# Patient Record
Sex: Female | Born: 1950 | Hispanic: No | Marital: Married | Smoking: Never smoker
Health system: Southern US, Community
[De-identification: ages and names within clinical notes are randomized; demographics above are authoritative.]

## PROBLEM LIST (undated history)

## (undated) DIAGNOSIS — E079 Disorder of thyroid, unspecified: Secondary | ICD-10-CM

## (undated) DIAGNOSIS — H269 Unspecified cataract: Secondary | ICD-10-CM

## (undated) DIAGNOSIS — M1990 Unspecified osteoarthritis, unspecified site: Secondary | ICD-10-CM

## (undated) HISTORY — DX: Disorder of thyroid, unspecified: E07.9

## (undated) HISTORY — DX: Unspecified cataract: H26.9

## (undated) HISTORY — PX: EYE SURGERY: SHX253

## (undated) HISTORY — PX: JOINT REPLACEMENT: SHX530

## (undated) HISTORY — DX: Unspecified osteoarthritis, unspecified site: M19.90

---

## 2003-03-18 ENCOUNTER — Other Ambulatory Visit: Admission: RE | Admit: 2003-03-18 | Discharge: 2003-03-18 | Payer: Self-pay | Admitting: Obstetrics and Gynecology

## 2004-08-14 ENCOUNTER — Other Ambulatory Visit: Admission: RE | Admit: 2004-08-14 | Discharge: 2004-08-14 | Payer: Self-pay | Admitting: Obstetrics and Gynecology

## 2005-03-02 ENCOUNTER — Encounter: Admission: RE | Admit: 2005-03-02 | Discharge: 2005-03-02 | Payer: Self-pay | Admitting: Family Medicine

## 2005-03-04 ENCOUNTER — Encounter: Admission: RE | Admit: 2005-03-04 | Discharge: 2005-03-04 | Payer: Self-pay | Admitting: Family Medicine

## 2005-05-03 ENCOUNTER — Ambulatory Visit (HOSPITAL_BASED_OUTPATIENT_CLINIC_OR_DEPARTMENT_OTHER): Admission: RE | Admit: 2005-05-03 | Discharge: 2005-05-03 | Payer: Self-pay | Admitting: Orthopedic Surgery

## 2005-05-03 ENCOUNTER — Ambulatory Visit (HOSPITAL_COMMUNITY): Admission: RE | Admit: 2005-05-03 | Discharge: 2005-05-03 | Payer: Self-pay | Admitting: Orthopedic Surgery

## 2007-04-08 ENCOUNTER — Inpatient Hospital Stay (HOSPITAL_COMMUNITY): Admission: RE | Admit: 2007-04-08 | Discharge: 2007-04-12 | Payer: Self-pay | Admitting: Orthopedic Surgery

## 2008-09-16 IMAGING — CR DG KNEE 1-2V PORT*R*
2 series · 2 of 2 positions shown · non-contrast
Comparison: None.

CLINICAL DATA: Postop right total knee arthroplasty.

PORTABLE RIGHT KNEE - 2 VIEW  [DATE]/[id number] [id number] hours:

[ap/obl knee]
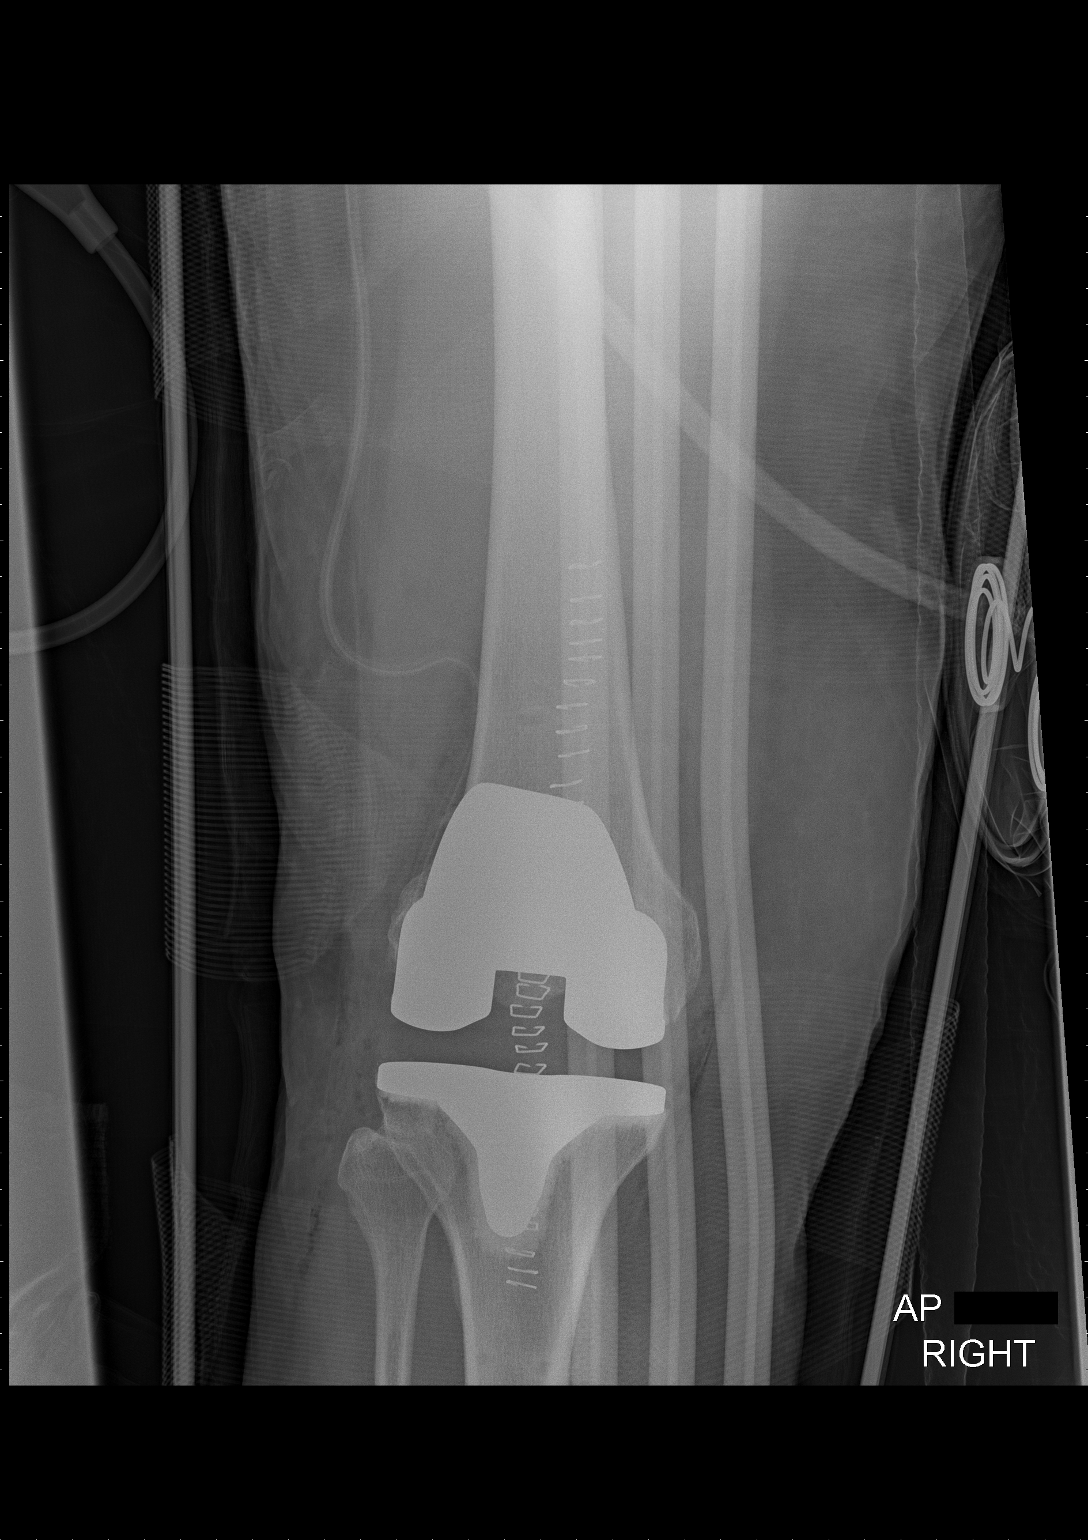

[knee lat]
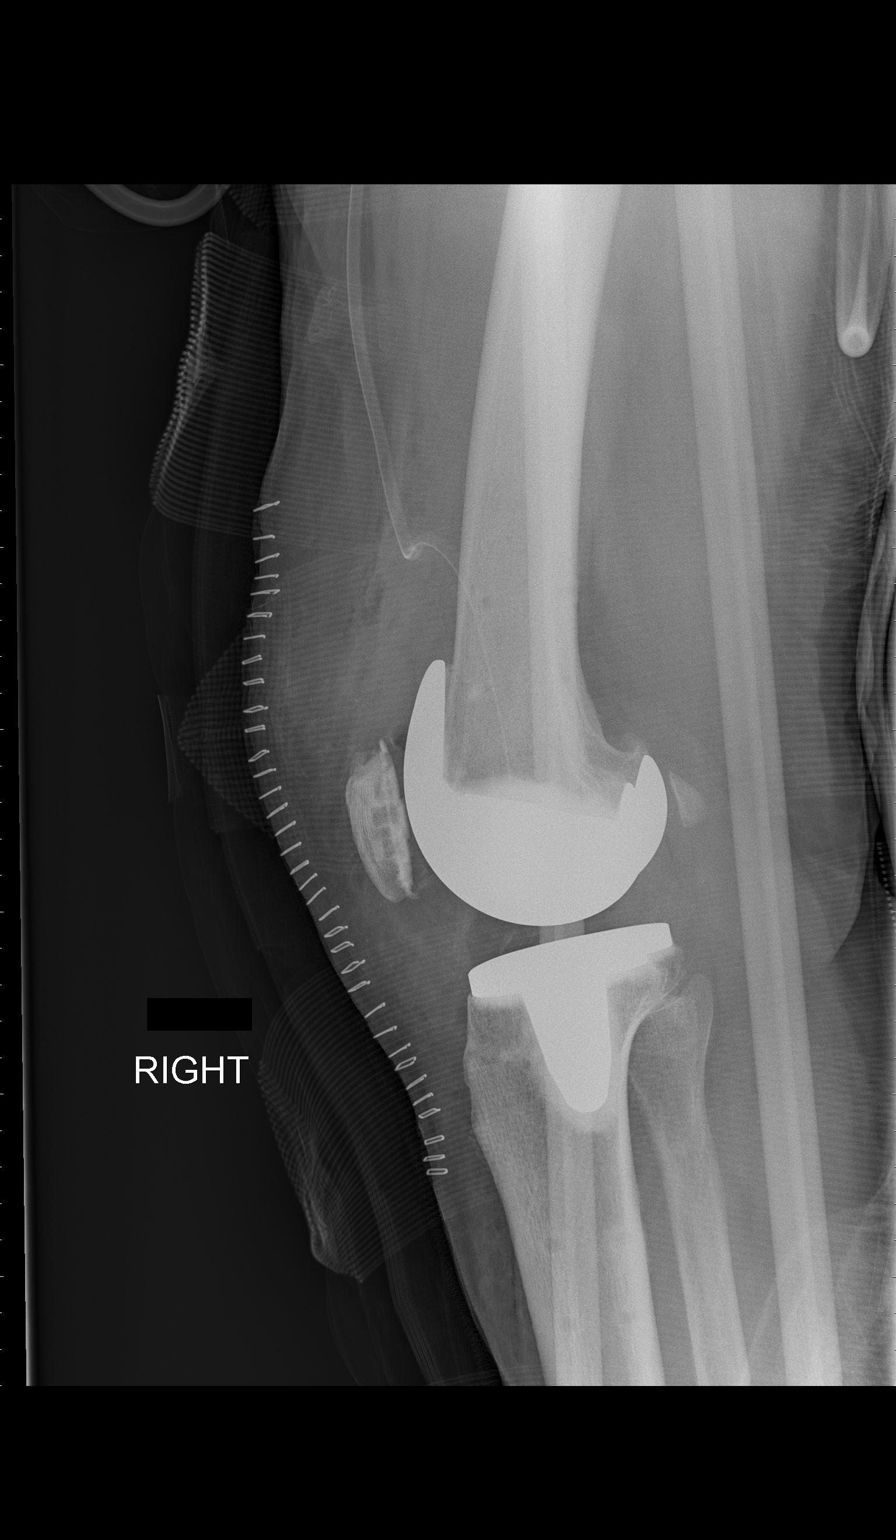

[2 of 2 positions shown; findings below may reference images not displayed]

FINDINGS: Right total knee arthroplasty with anatomic alignment. No acute
complicating features. Surgical drain in place.
IMPRESSION: Anatomic alignment status post right total knee arthroplasty without acute
complicating features.

## 2010-10-03 NOTE — Op Note (Signed)
NAME:  GLEMA, TAKAKI NO.:  [id number]   MEDICAL RECORD NO.:  [id number]          PATIENT TYPE:  INP   LOCATION:  5032                         FACILITY:  [facility]   PHYSICIAN:  Burnard Bunting, M.D.    DATE OF BIRTH:  June 29, 1950   DATE OF PROCEDURE:  04/08/2007  DATE OF DISCHARGE:                               OPERATIVE REPORT   PREOPERATIVE DIAGNOSIS:  Right knee arthritis.   POSTOPERATIVE DIAGNOSIS:  Right knee arthritis.   PROCEDURE:  Right total knee replacement.   SURGEON:  Burnard Bunting, M.D.   ASSISTANT:  Wende Neighbors, P.A.   ANESTHESIA:  General tracheal.   BLOOD LOSS:  100 mL.   DRAINS:  None.   TOURNIQUET TIME:  1 hour and 55 minutes at 300 mmHg.   INDICATIONS:  Katie Houston is a 60 year old patient with end-stage  right knee arthritis, who presents now for total knee replacement after  failure of conservative management.   COMPONENTS UTILIZED:  DePuy size 3 femur, 3 tibia, 12.5 poly insert,  posterior stabilized with 32 patella.   PROCEDURE IN DETAIL:  The patient brought to operating room where  general endotracheal anesthesia was induced.  Time out was called.  Right leg was prepped with DuraPrep solution and draped in a sterile  manner.  Collier Flowers was used to cover the operative field.  The leg was  elevated and exsanguinated with Esmarch wrap.  Tourniquet was inflated.  Anterior approach to the knee was utilized and skin and subcutaneous  tissue sharply divided.  The median parapatellar arthrotomy was made.  Precise location of the arthrotomy was marked #1 Vicryl suture.  Patellar fat pad was partially excised.  Soft tissue above the  periosteum was removed from the anterior distal femur and lateral  patellofemoral ligament was then released.  Two pins were then placed in  the anterior distal medial femur.  Bicortical fixation was achieved.  Two pins were placed in the proximal medial tibia.  Bicortical fixation  was achieved.   Registration points were then obtained beginning with hip  center of rotation, medial and lateral malleolus and points around the  knee.  ACL and PCL were released in accordance with preoperative  templating and computer-generated modeling, cut perpendicular to the  tibia was made.  Collateral ligaments and the posterior structures were  well protected.  The tibial cut was then made the oscillating saw.  The  tensioner was then placed in full extension in full in 90 degrees of  flexion.  Distal femoral cut was then made.  Spacer blocks confirmed  that the knee was in neutral mechanical orientation and achieved full  extension.  At this time the anterior, posterior and chamfer cuts were  completed.  The box cut was performed.  The trial reduction was then  performed with the components in position.  The patient had a full  extension, full flexion with excellent patellar tracking and a good  stability to varus valgus stress at 0, 30 and 90 degrees with the 12.5  mm spacer.  The tibial keel punch was then performed.  The patella was  then prepared freehand fashion with a 11-mm resection off 24-mm patella.  Trial 32 was then drilled and placed.  Again stability was maintained,  full extension was achieved and patient had good stability varus-valgus  stress at 0, 30 and 90 degrees flexion.  Trial components were removed.  True components were then cemented into position.  Excess cement was  removed.  Tourniquet was released.  Bleeding points encountered were  controlled using electrocautery.  Hemovac drain was placed.  Median  parapatellar arthrotomy was then closed using interrupted inverted 0  Vicryl suture after placement of a Hemovac.  The next layer was closed  using interrupted inverted 0 Vicryl suture followed by interrupted  inverted 2-0 Vicryl suture and the skin staples.  Pins for registration  points were removed and this incision re-irrigated and closed using 3-0  nylon suture.  The  patient did wrapped in a bulky Jones dressing and  knee immobilizer was placed.  Tolerated well without immediate  complication.      Burnard Bunting, M.D.  Electronically Signed     GSD/MEDQ  D:  04/08/2007  T:  04/09/2007  Job:  [id number]

## 2010-10-06 NOTE — Discharge Summary (Signed)
NAME:  Katie Houston, Katie Houston NO.:  [id number]   MEDICAL RECORD NO.:  [id number]          PATIENT TYPE:  INP   LOCATION:  5032                         FACILITY:  [facility]   PHYSICIAN:  Wende Neighbors, P.A. DATE OF BIRTH:  1951/04/22   DATE OF ADMISSION:  04/08/2007  DATE OF DISCHARGE:  04/12/2007                               DISCHARGE SUMMARY   ADMISSION DIAGNOSIS:  Right knee arthritis.   DISCHARGE DIAGNOSIS:  1. Right knee arthritis.  2. Mild posthemorrhagic anemia.   PROCEDURE:  On April 08, 2007 the patient underwent right total knee  arthroplasty performed by Dr. August Saucer, assisted by Maud Deed, PA-C  under general anesthesia.   CONSULTATIONS:  None.   BRIEF HISTORY:  The patient is a 60 year old female with end-stage right  knee arthritis.  She has had failure of conservative management and is  admitted for right total knee replacement.   BRIEF HOSPITAL COURSE:  The patient tolerated the procedure under  general anesthesia without complications.  Postoperatively she was  placed on the usual physical therapy protocol for total knee replacement  involving CPM machine, active range of motion, passive range of motion,  stretching, strengthening and ambulation.  The patient was allowed  weightbearing as tolerated on the operative extremity.  She was able to  ambulate with a walker and use her knee immobilizer for ambulatory  purposes.  Neurovascular motor function of the lower extremities  remained intact throughout the hospital stay.  The Hemovac drain was  discontinued on the first postoperative day and her wound was healing  well through the hospital stay.  Coumadin was started for DVT  prophylaxis and managed by the pharmacy at [facility].  Hemoglobin  and hematocrit dropped to the lowest value of 10.8 and 32.3  postoperatively.  INR at discharge was 2.1.  Chemistry studies normal  through the hospital stay with the exception of one episode of  hyponatremia.  Urinalysis on admission with large bilirubin, trace  leukocyte esterase.  Urine culture no growth.  Chest x-ray on admission  no active lung disease.  EKG on admission sinus bradycardia, no  significant change since last tracing confirmed by Dr. Katrinka Blazing.   PLAN:  The patient was discharged home.  She will utilize her CPM  machine 2 hours out of every 8 hours and increase 5 degrees daily.  She  is advised to keep her incision dry and clean and change the dressing as  needed.  Home health physical therapy to assist with ambulation and gait  training, range of motion, stretching and strengthening exercises.  Knee  immobilizer to be discontinued when the patient is able to do a straight  leg raise.  Genevieve Norlander to assist the patient with Coumadin management.  The  patient will follow up with Dr. August Saucer 2 weeks from  surgery.  Prescriptions given at discharge, Percocet one to two every 4-  6 hours as needed for pain, Robaxin 500 mg one every 8 hours as needed  for spasm.  She will not resume her etodolac which was taken prior to  admission.  All questions encouraged and answered.  Wende Neighbors, P.A.     SMV/MEDQ  D:  05/29/2007  T:  05/30/2007  Job:  [id number]

## 2010-10-06 NOTE — Op Note (Signed)
NAME:  Katie Houston, Katie Houston NO.:  [id number]   MEDICAL RECORD NO.:  [id number]          PATIENT TYPE:  AMB   LOCATION:  DSC                          FACILITY:  [facility]   PHYSICIAN:  Nadara Mustard, MD     DATE OF BIRTH:  07-Apr-1951   DATE OF PROCEDURE:  05/03/2005  DATE OF DISCHARGE:                                 OPERATIVE REPORT   PREOPERATIVE DIAGNOSIS:  Haglund deformity with Achilles tendinitis, left  ankle.   POSTOPERATIVE DIAGNOSIS:  Haglund deformity with Achilles tendinitis, left  ankle.   PROCEDURE:  Haglund resection endoscopically, left ankle.   SURGEON:  Nadara Mustard, MD   ANESTHESIA:  General.   ESTIMATED BLOOD LOSS:  Minimal.   ANTIBIOTICS:  One gram of Kefzol.   DRAINS:  None.   COMPLICATIONS:  None.   TOURNIQUET TIME:  None.   DISPOSITION:  To PACU in stable condition.   INDICATION FOR PROCEDURE:  The patient is a 60 year old woman with Achilles  tendinitis and a painful Haglund deformity.  The patient has failed  conservative care including therapy and activity modifications, as well as  anti-inflammatories, and presents at this time for surgical intervention.  The risks and benefits were discussed including infection, neurovascular  injury, persistent pain, rupture the Achilles, need for additional surgery.  The patient states he understands and wishes to proceed at this time.   DESCRIPTION OF PROCEDURE:  The patient was brought to OR room 8 and  underwent general anesthetic.  After an adequate level of anesthesia was  obtained, the patient was placed prone on the operating table and the left  lower extremity was prepped using DuraPrep and draped into a sterile field.  The inflow and outflow portals were established both medially and laterally  just proximal to the Haglund deformity, medial and lateral to the Achilles.  A blunt trocar was inserted through both of the portals and the shaver with  the acromionizer blade was rotated  between both portals as well as inflow  being rotated and the inflow was with normal saline with gravity.  Using the  C-arm fluoroscopy for direct visualization, the Haglund deformity was  resected.  The Achilles tendon was intact postoperatively.  She had good  range of motion of the ankle.  The instruments were removed.  The portals  were  closed using 4-0 nylon.  The wound was covered with Adaptic, orthopedic  sponges, sterile Webril and a Coban dressing.  The patient was placed in a  fracture boot.  She will be touchdown weightbearing with crutches.  She has  a prescription for Vicodin for pain and plan to follow up in the office in 2  weeks.      Nadara Mustard, MD  Electronically Signed     MVD/MEDQ  D:  05/03/2005  T:  05/07/2005  Job:  [id number]

## 2011-02-27 LAB — BASIC METABOLIC PANEL
BUN: 8
BUN: 9
BUN: 18
CO2: 27
CO2: 29
CO2: 24
Calcium: 7.7 — ABNORMAL LOW
Calcium: 8.3 — ABNORMAL LOW
Calcium: 9
Chloride: 101
Chloride: 102
Chloride: 103
Creatinine, Ser: 0.7
Creatinine, Ser: 0.78
Creatinine, Ser: 0.72
GFR calc Af Amer: >60
GFR calc Af Amer: >60
GFR calc Af Amer: >60
GFR calc non Af Amer: >60
GFR calc non Af Amer: >60
GFR calc non Af Amer: >60
Glucose, Bld: 108 — ABNORMAL HIGH
Glucose, Bld: 129 — ABNORMAL HIGH
Glucose, Bld: 80
Potassium: 3.6
Potassium: 3.7
Potassium: 4.1
Sodium: 134 — ABNORMAL LOW
Sodium: 134 — ABNORMAL LOW
Sodium: 138

## 2011-02-27 LAB — CBC
HCT: 30 — ABNORMAL LOW
HCT: 32.3 — ABNORMAL LOW
HCT: 43.2
Hemoglobin: 10.2 — ABNORMAL LOW
Hemoglobin: 10.8 — ABNORMAL LOW
Hemoglobin: 14.2
MCHC: 33.6
MCHC: 34
MCHC: 33
MCV: 80.4
MCV: 81
MCV: 80.7
Platelets: 173
Platelets: 183
Platelets: 247
RBC: 3.73 — ABNORMAL LOW
RBC: 3.98
RBC: 5.35 — ABNORMAL HIGH
RDW: 13.5
RDW: 13.7
RDW: 13.9
WBC: 8.3
WBC: 8.5
WBC: 9

## 2011-02-27 LAB — DIFFERENTIAL
Basophils Absolute: 0.1
Basophils Relative: 1
Eosinophils Absolute: 0.5
Eosinophils Relative: 6 — ABNORMAL HIGH
Lymphocytes Relative: 33
Lymphs Abs: 3
Monocytes Absolute: 0.4
Monocytes Relative: 4
Neutro Abs: 5
Neutrophils Relative %: 56

## 2011-02-27 LAB — POCT I-STAT EG7
Acid-Base Excess: 1
Acid-Base Excess: 1
Acid-Base Excess: 2
Bicarbonate: 25.9 — ABNORMAL HIGH
Bicarbonate: 28.2 — ABNORMAL HIGH
Bicarbonate: 28.5 — ABNORMAL HIGH
Calcium, Ion: 1.17
Calcium, Ion: 1.26
Calcium, Ion: 1.26
HCT: 36
HCT: 41
HCT: 41
Hemoglobin: 12.2
Hemoglobin: 13.9
Hemoglobin: 13.9
O2 Saturation: 20
O2 Saturation: 21 — ABNORMAL LOW
O2 Saturation: 99
Operator id: 129461
Operator id: 129461
Operator id: 129461
Potassium: 3.7
Potassium: 3.9
Potassium: 3.9
Sodium: 138
Sodium: 138
Sodium: 139
TCO2: 27
TCO2: 30
TCO2: 30
pCO2, Ven: 36.3 — ABNORMAL LOW
pCO2, Ven: 55.5 — ABNORMAL HIGH
pCO2, Ven: 55.7 — ABNORMAL HIGH
pH, Ven: 7.313 — ABNORMAL HIGH
pH, Ven: 7.318 — ABNORMAL HIGH
pH, Ven: 7.461 — ABNORMAL HIGH
pO2, Ven: 141 — ABNORMAL HIGH
pO2, Ven: 17 — CL
pO2, Ven: 18 — CL

## 2011-02-27 LAB — PROTIME-INR
INR: 1
INR: 1.7 — ABNORMAL HIGH
INR: 2.4 — ABNORMAL HIGH
INR: 3.1 — ABNORMAL HIGH
INR: 0.9
Prothrombin Time: 13.4
Prothrombin Time: 20.9 — ABNORMAL HIGH
Prothrombin Time: 26.5 — ABNORMAL HIGH
Prothrombin Time: 32.8 — ABNORMAL HIGH
Prothrombin Time: 11.8

## 2011-02-27 LAB — ABO/RH: ABO/RH(D): B NEG

## 2011-02-27 LAB — URINALYSIS, ROUTINE W REFLEX MICROSCOPIC
Glucose, UA: NEGATIVE
Hgb urine dipstick: NEGATIVE
Ketones, ur: NEGATIVE
Nitrite: NEGATIVE
Protein, ur: NEGATIVE
Specific Gravity, Urine: 1.013
Urobilinogen, UA: 0.2
pH: 6.5

## 2011-02-27 LAB — TYPE AND SCREEN
ABO/RH(D): B NEG
Antibody Screen: NEGATIVE

## 2011-02-27 LAB — URINE CULTURE
Colony Count: NO GROWTH
Culture: NO GROWTH

## 2011-02-27 LAB — URINE MICROSCOPIC-ADD ON

## 2011-09-25 ENCOUNTER — Ambulatory Visit
Admission: RE | Admit: 2011-09-25 | Discharge: 2011-09-25 | Disposition: A | Discharge status: Home / Self Care | Payer: Managed Care, Other (non HMO) | Source: Ambulatory Visit | Attending: Specialist | Admitting: Specialist

## 2011-09-25 ENCOUNTER — Other Ambulatory Visit: Payer: Self-pay | Admitting: Specialist

## 2011-09-25 DIAGNOSIS — M7989 Other specified soft tissue disorders: Secondary | ICD-10-CM

## 2011-09-25 DIAGNOSIS — M79606 Pain in leg, unspecified: Secondary | ICD-10-CM

## 2013-03-05 IMAGING — US US EXTREM LOW VENOUS*R*
1 series · 14 of 22 positions shown · non-contrast
Comparison: None

CLINICAL DATA: Right leg pain and swelling.

RIGHT LOWER EXTREMITY VENOUS DUPLEX ULTRASOUND
TECHNIQUE: Gray-scale sonography with graded compression, as well
as color Doppler and duplex ultrasound, were performed to evaluate
the deep venous system of the lower extremity from the level of the
common femoral vein through the popliteal and proximal calf veins.
Spectral Doppler was utilized to evaluate flow at rest and with
distal augmentation maneuvers.

[Series 1: us extrem low venous*right* · 14 of 22 slices shown]
[im 1/22]
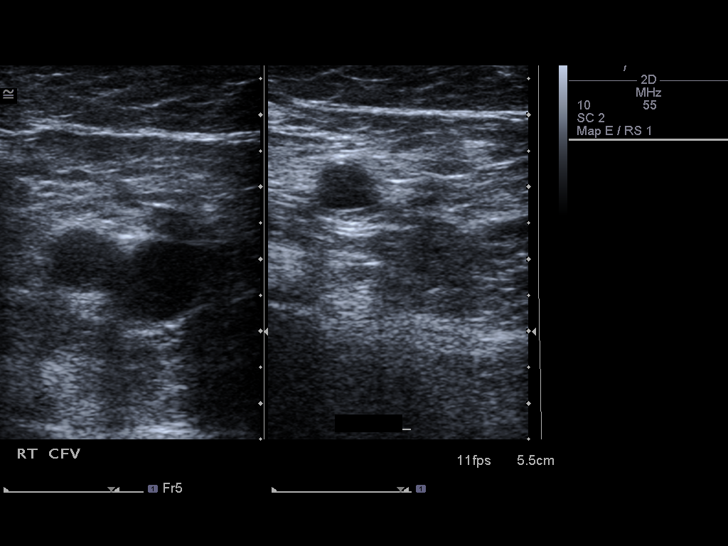
[im 3/22]
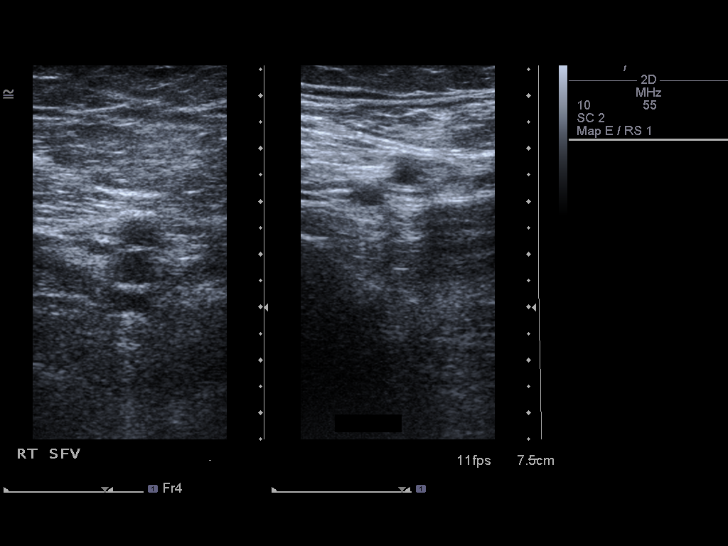
[im 4/22]
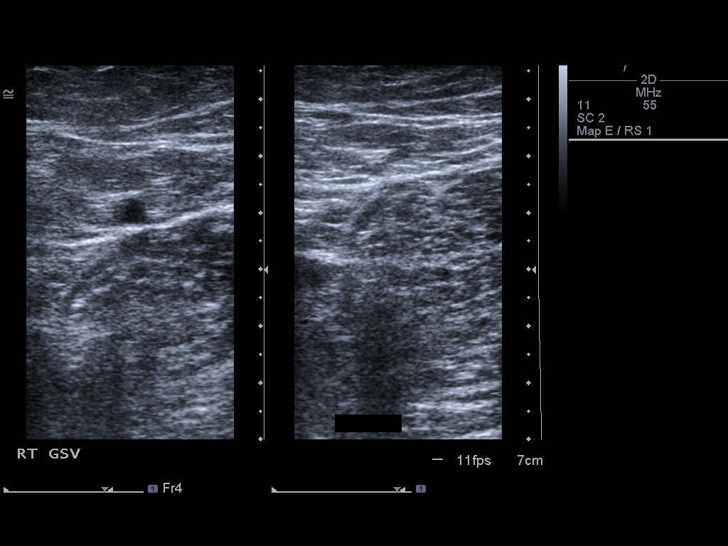
[im 6/22]
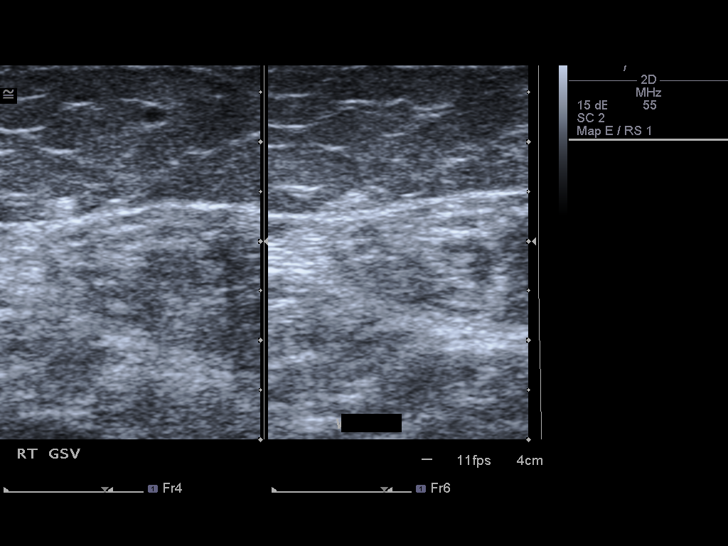
[im 8/22]
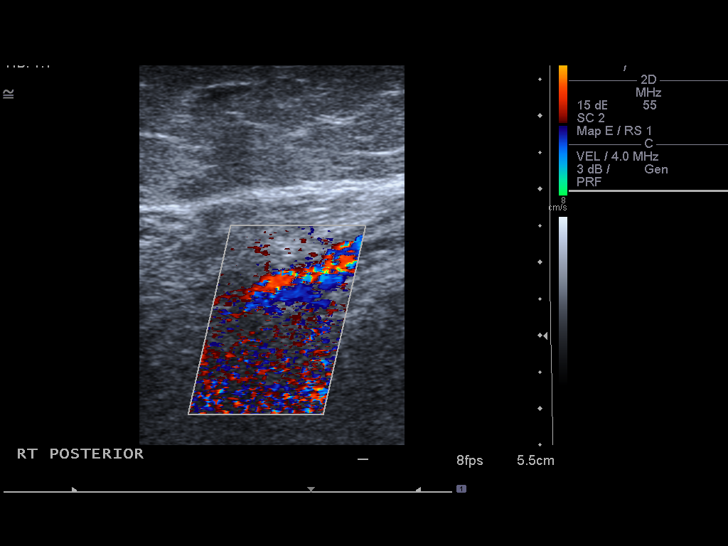
[im 9/22]
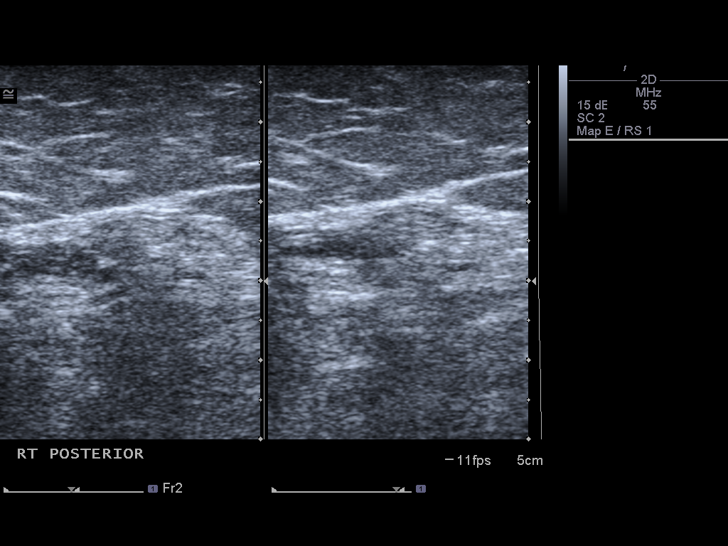
[im 11/22]
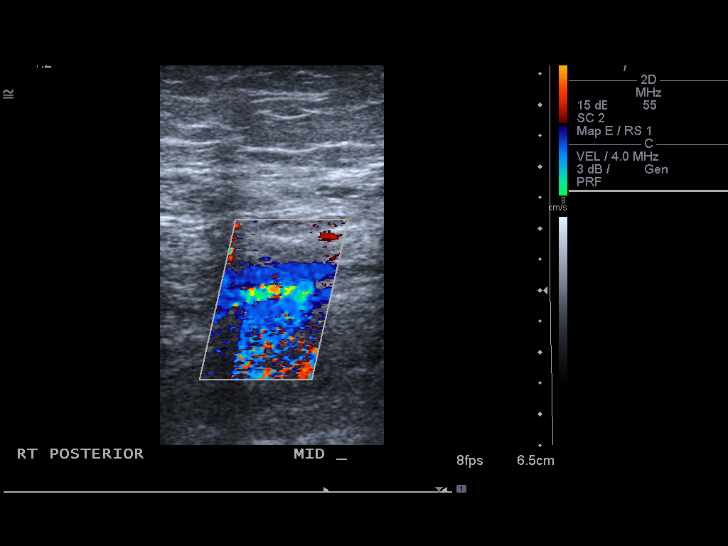
[im 12/22]
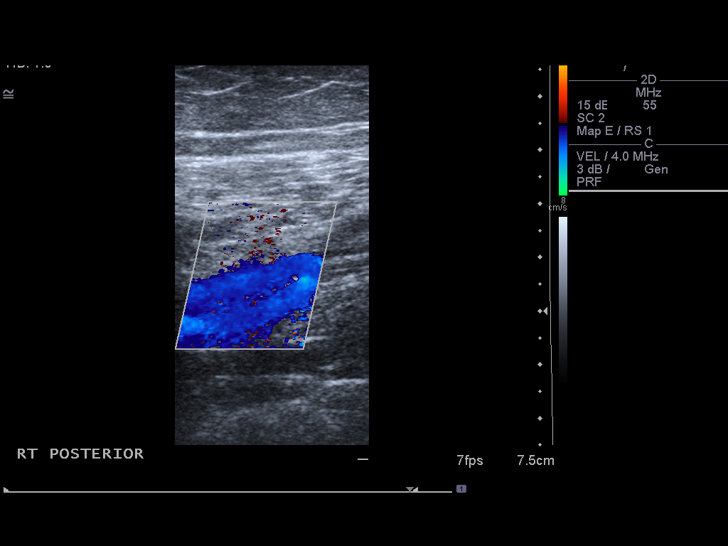
[im 14/22]
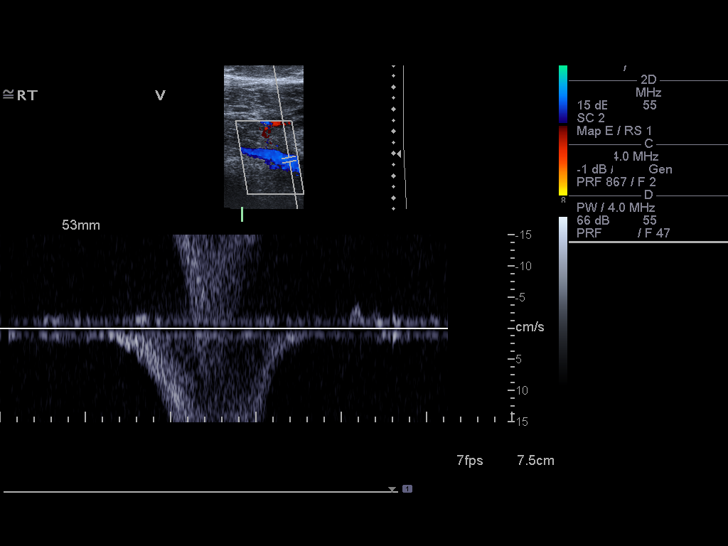
[im 15/22]
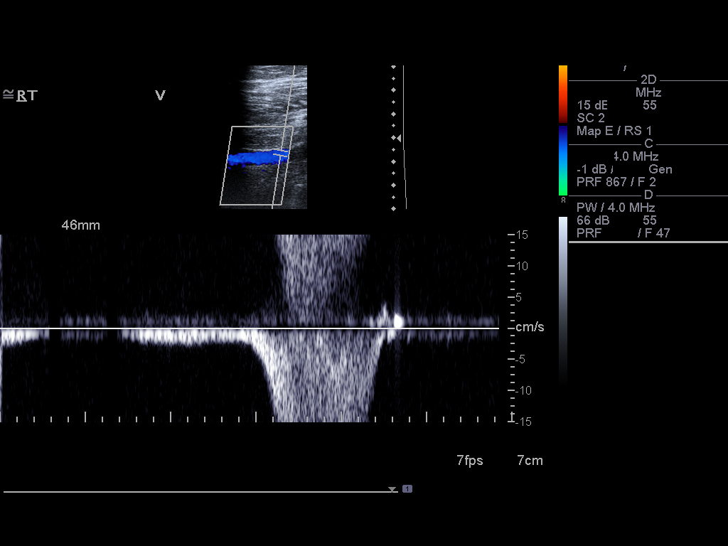
[im 17/22]
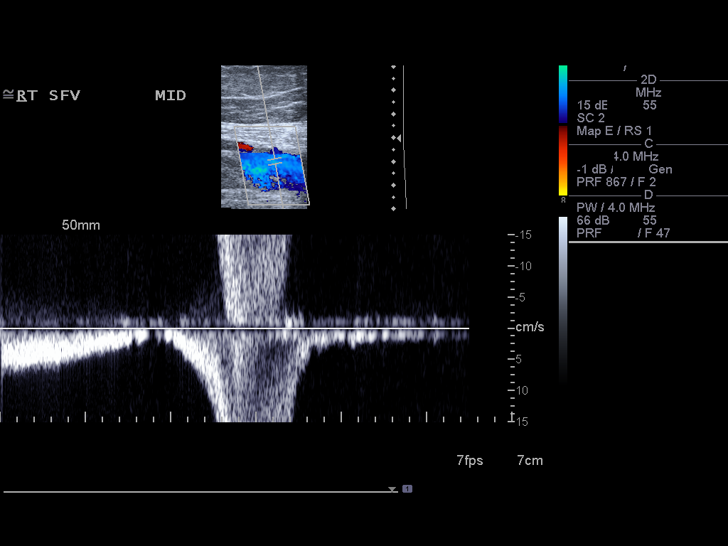
[im 19/22]
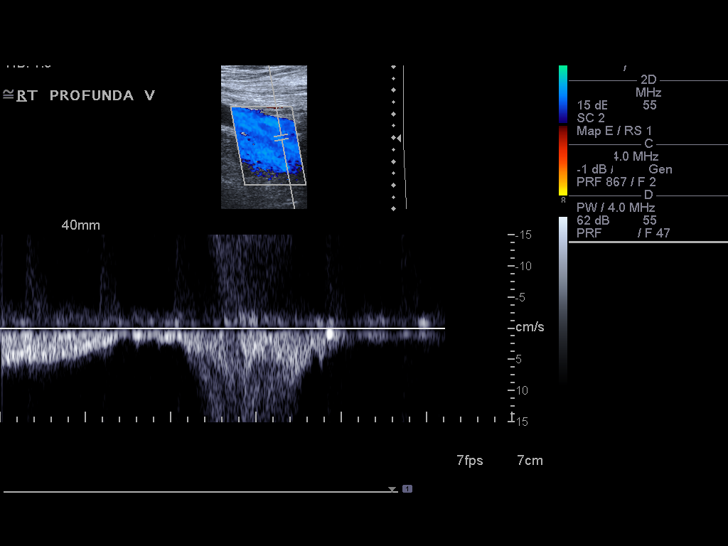
[im 20/22]
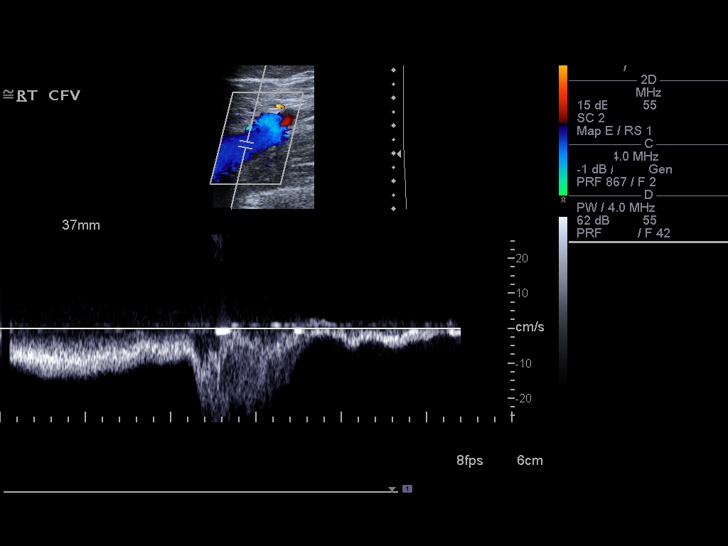
[im 22/22]
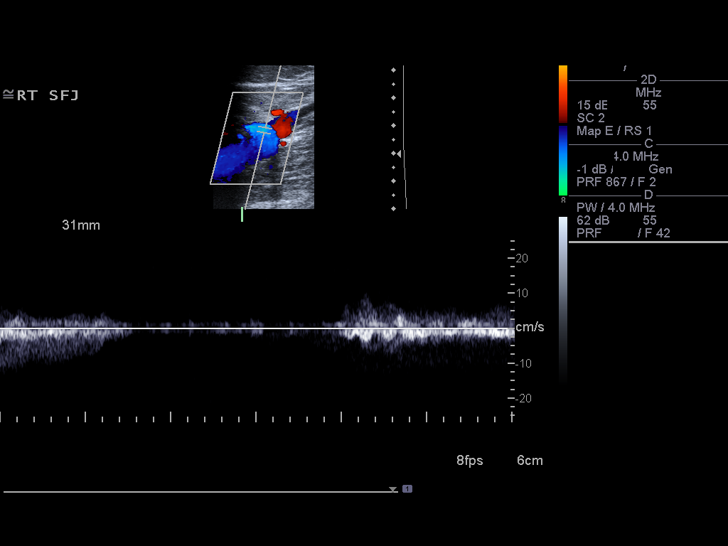

[14 of 22 positions shown; findings below may reference images not displayed]

FINDINGS: There is patent flow in the common femoral, profunda
femoral, superficial femoral and popliteal veins.  These vessels
were completely compressible and demonstrate increased flow with
augmentation.
IMPRESSION: Negative venous Doppler examination for deep venous thrombosis.

## 2013-05-26 ENCOUNTER — Ambulatory Visit (INDEPENDENT_AMBULATORY_CARE_PROVIDER_SITE_OTHER): Payer: 59 | Admitting: Family Medicine

## 2013-05-26 ENCOUNTER — Encounter: Payer: Self-pay | Admitting: Family Medicine

## 2013-05-26 VITALS — BP 130/80 | HR 60 | Temp 98.0°F | Resp 16 | Ht 60.5 in | Wt 197.0 lb

## 2013-05-26 DIAGNOSIS — E039 Hypothyroidism, unspecified: Secondary | ICD-10-CM

## 2013-05-26 DIAGNOSIS — E785 Hyperlipidemia, unspecified: Secondary | ICD-10-CM

## 2013-05-26 DIAGNOSIS — H269 Unspecified cataract: Secondary | ICD-10-CM

## 2013-05-26 DIAGNOSIS — M1990 Unspecified osteoarthritis, unspecified site: Secondary | ICD-10-CM

## 2013-05-26 MED ORDER — LEVOTHYROXINE SODIUM 88 MCG PO TABS: 88.0000 ug | ORAL_TABLET | Freq: Every day | ORAL | Status: DC

## 2013-05-26 MED ORDER — LOVASTATIN 20 MG PO TABS: 20.0000 mg | ORAL_TABLET | Freq: Every day | ORAL | Status: DC

## 2013-05-26 NOTE — Progress Notes (Signed)
S:  This 63 y.o. [place] is [place] [facility]; she obtained health insurance fall 2014. [facility]. She has hypothyroidism and a lipid disorder. Labs were drawn at clinic in July 2014 (results to be scanned into EPIC). TSH was below normal range; pt reports thyroid med dose was decreased from 100 mcg to current dose of 88 mcg. She has no c/o fatigue, abnormal weight change, diaphoresis, CP or tightness, palpitations, SOB or DOE,  rashes, constipation or other GI problems, HA, dizziness, weakness or syncope.  She takes this medication in morning before breakfast. She takes lipid med at bedtime. No reports of myalgias or back pain or GI upset.   Pt has cataract on L eye and has been referred to specialist but needs authorized referral from this PCP. App[facility] is May 29, 2013 [facility] w/ Dr. Delaney MeigsStonecipher.  Pt has chronic  DJD involving hips and knees. Procedure in past involving R hip and R knee. Pt has med to take but she uses it once a day prn. Now having increased L knee pain; was advised 5 years ago at time of R knee procedure, that she would need to have R knee surgery. Pt anticipates need to see ORTHO later this year but will continue w/ conservative treatment for now. No falls have occurred and pt denies weakness in lower extremities.  Patient Active Problem List   Diagnosis Date Noted  . Unspecified hypothyroidism 05/26/2013  . Dyslipidemia 05/26/2013  . DJD (degenerative joint disease) 05/26/2013   PMHx, Surg Hx, Soc and Fam Hx reviewed.  Medications reconciled.  ROS: As per HPI.  O: Filed Vitals:   05/26/13 1351  BP: 130/80  Pulse: 60  Temp: 98 F (36.7 C)  Resp: 16   GEN: In NAD; WN,WD. HENT: [place]/AT. EOMI w/ clear conj/sclerae. EACs/nose/ oroph unremarkable. NECK: Supple w/o LAN or TMG. COR: RRR. Normal S1 and S2. LUNGS: CTA; no wheezes or rales. Unlabored resp. SKIN: W&D; intact w/o diaphoresis, erythema, rashes or  pallor. MS: Bilateral knees- degenerative changes w/ mild crepitus and warmth of L knee. Valgus deformity bilaterally. NEURO: A&O x 3; CNs intact. Nonfocal.  A/P: Unspecified hypothyroidism- Stable; continue current medication.  Dyslipidemia  Cataract - Plan: Ambulatory referral to Ophthalmology  DJD (degenerative joint disease)- Continue current NSAID; pt will contact office if symptoms worsen.

## 2013-05-26 NOTE — Patient Instructions (Signed)
I have refilled your medications and ordered the referral to the Ophthalmologist. You will come back in 3 months for physical and fasting lab work (recheck lipids and thyroid tests).

## 2013-05-27 DIAGNOSIS — E669 Obesity, unspecified: Secondary | ICD-10-CM | POA: Insufficient documentation

## 2013-08-26 ENCOUNTER — Encounter: Payer: 59 | Admitting: Family Medicine

## 2013-09-30 ENCOUNTER — Ambulatory Visit (INDEPENDENT_AMBULATORY_CARE_PROVIDER_SITE_OTHER): Payer: 59 | Admitting: Family Medicine

## 2013-09-30 ENCOUNTER — Encounter: Payer: Self-pay | Admitting: Family Medicine

## 2013-09-30 VITALS — BP 120/60 | HR 56 | Temp 97.6°F | Resp 16 | Ht 60.5 in | Wt 178.6 lb

## 2013-09-30 DIAGNOSIS — E785 Hyperlipidemia, unspecified: Secondary | ICD-10-CM

## 2013-09-30 DIAGNOSIS — E039 Hypothyroidism, unspecified: Secondary | ICD-10-CM

## 2013-09-30 DIAGNOSIS — Z0000 Encounter for general adult medical examination without abnormal findings: Secondary | ICD-10-CM

## 2013-09-30 DIAGNOSIS — N63 Unspecified lump in unspecified breast: Secondary | ICD-10-CM

## 2013-09-30 DIAGNOSIS — N6320 Unspecified lump in the left breast, unspecified quadrant: Secondary | ICD-10-CM

## 2013-09-30 LAB — POCT URINALYSIS DIPSTICK
BILIRUBIN UA: NEGATIVE
Glucose, UA: NEGATIVE
KETONES UA: NEGATIVE
Leukocytes, UA: NEGATIVE
Nitrite, UA: NEGATIVE
PH UA: 6.5
PROTEIN UA: NEGATIVE
Urobilinogen, UA: 0.2

## 2013-09-30 MED ORDER — NABUMETONE 500 MG PO TABS: 500.0000 mg | ORAL_TABLET | Freq: Every day | ORAL | Status: AC

## 2013-09-30 MED ORDER — LEVOTHYROXINE SODIUM 88 MCG PO TABS: 88.0000 ug | ORAL_TABLET | Freq: Every day | ORAL | Status: DC

## 2013-09-30 MED ORDER — LOVASTATIN 20 MG PO TABS: 20.0000 mg | ORAL_TABLET | Freq: Every day | ORAL | Status: DC

## 2013-09-30 NOTE — Patient Instructions (Addendum)
Keeping You Healthy  Get These Tests  Blood Pressure- Have your blood pressure checked by your healthcare provider at least once a year.  Normal blood pressure is 120/80.  Weight- Have your body mass index (BMI) calculated to screen for obesity.  BMI is a measure of body fat based on height and weight.  You can calculate your own BMI at https://www.west-esparza.com/www.nhlbisupport.com/bmi/  Cholesterol- Have your cholesterol checked every year.  Diabetes- Have your blood sugar checked every year if you have high blood pressure, high cholesterol, a family history of diabetes or if you are overweight.  Pap Smear- Have a pap smear every 1 to 3 years if you have been sexually active.  If you are older than 65 and recent pap smears have been normal you may not need additional pap smears.  In addition, if you have had a hysterectomy  For benign disease additional pap smears are not necessary.  Mammogram-Yearly mammograms are essential for early detection of breast cancer. You have a small mass or thickened area in your left breast. This requires more evaluation. I have ordered a Diagnostic Mammogram ASAP.  Screening for Colon Cancer- Colonoscopy starting at age 64. Screening may begin sooner depending on your family history and other health conditions.  Follow up colonoscopy as directed by your Gastroenterologist.  Screening for Osteoporosis- Screening begins at age 64 with bone density scanning, sooner if you are at higher risk for developing Osteoporosis.  Get these medicines  Calcium with Vitamin D- Your body requires 1200-1500 mg of Calcium a day and [phone] IU of Vitamin D a day.  You can only absorb 500 mg of Calcium at a time therefore Calcium must be taken in 2 or 3 separate doses throughout the day.  Hormones- Hormone therapy has been associated with increased risk for certain cancers and heart disease.  Talk to your healthcare provider about if you need relief from menopausal symptoms.  Aspirin- Ask your  healthcare provider about taking Aspirin to prevent Heart Disease and Stroke.  Get these Immuniztions  Flu shot- Every fall  Pneumonia shot- Once after the age of 64; if you are younger ask your healthcare provider if you need a pneumonia shot.  Tetanus- Every ten years. You have reported a Tetanus when you immigrated to this country in 2008.   Zostavax- Once after the age of 64 to prevent shingles. You have decided to postpone this vaccine until after you have moved to [place].  Take these steps  Don't smoke- Your healthcare provider can help you quit. For tips on how to quit, ask your healthcare provider or go to www.smokefree.gov or call 1-800 QUIT-NOW.  Be physically active- Exercise 5 days a week for a minimum of 30 minutes.  If you are not already physically active, start slow and gradually work up to 30 minutes of moderate physical activity.  Try walking, dancing, bike riding, swimming, etc.  Eat a healthy diet- Eat a variety of healthy foods such as fruits, vegetables, whole grains, low fat milk, low fat cheeses, yogurt, lean meats, chicken, fish, eggs, dried beans, tofu, etc.  For more information go to www.thenutritionsource.org  Dental visit- Brush and floss teeth twice daily; visit your dentist twice a year.  Eye exam- Visit your Optometrist or Ophthalmologist yearly.  Drink alcohol in moderation- Limit alcohol intake to one drink or less a day.  Never drink and drive.  Depression- Your emotional health is as important as your physical health.  If you're feeling down or losing  interest in things you normally enjoy, please talk to your healthcare provider.  Seat Belts- can save your life; always wear one  Smoke/Carbon Monoxide detectors- These detectors need to be installed on the appropriate level of your home.  Replace batteries at least once a year.  Violence- If anyone is threatening or hurting you, please tell your healthcare provider.  Living Will/ Health care power  of attorney- Discuss with your healthcare provider and family.

## 2013-09-30 NOTE — Progress Notes (Signed)
Subjective:    Patient ID: Katie Houston, female    DOB: June 23, 1950, 63 y.o.   MRN: [id number]  HPI  This 63 y.o. [place] female is here for CPE; she and her husband are moving to [place] on Oct 03, 2013. She needs medication refills for thyroid replacement and statin. She has no medication side effects. 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  HCM: MMG- 3 years ago.           CRS- Current- 2013 (normal- Dr. Loreta Ave).           IMM- Tetanus ~ 2008 per pt; interested in Zostavax but will get it later in [place].           Vision- Current; recent cataract  Patient Active Problem List   Diagnosis Date Noted  . Obesity, unspecified 05/27/2013  . Unspecified hypothyroidism 05/26/2013  . Dyslipidemia 05/26/2013  . DJD (degenerative joint disease) 05/26/2013   Prior to Admission medications   Medication Sig Start Date End Date Taking? Authorizing Provider  levothyroxine (SYNTHROID, LEVOTHROID) 88 MCG tablet Take 1 tablet (88 mcg total) by mouth daily before breakfast.   Yes Maurice March, MD  lovastatin (MEVACOR) 20 MG tablet Take 1 tablet (20 mg total) by mouth at bedtime.   Yes Maurice March, MD  nabumetone (RELAFEN) 500 MG tablet Take 1 tablet (500 mg total) by mouth daily.   Yes Maurice March, MD  OVER THE COUNTER MEDICATION OTC Fish Oil taking   Yes Historical Provider, MD   PMHx, Surg Hx, Soc and Fam Hx reviewed.   Review of Systems  Constitutional: Negative.   HENT: Negative.   Eyes: Negative.   Respiratory: Negative.   Cardiovascular: Positive for leg swelling.  Gastrointestinal: Negative.   Endocrine: Negative.   Genitourinary: Negative.   Musculoskeletal: Positive for arthralgias and joint swelling.  Skin: Negative.   Allergic/Immunologic: Negative.   Neurological: Negative.   Hematological: Negative.   Psychiatric/Behavioral: Negative.       Objective:   Physical Exam  Nursing note and vitals reviewed. Constitutional: She  is oriented to person, place, and time. Vital signs are normal. She appears well-developed and well-nourished. No distress.  HENT:  Head: Normocephalic and atraumatic.  Right Ear: Hearing, tympanic membrane, external ear and ear canal normal.  Left Ear: Hearing, tympanic membrane, external ear and ear canal normal.  Nose: Nose normal. No nasal deformity or septal deviation.  Mouth/Throat: Uvula is midline, oropharynx is clear and moist and mucous membranes are normal. No oral lesions. Normal dentition.  Dental repair- partials,etc.  Eyes: Conjunctivae, EOM and lids are normal. Pupils are equal, round, and reactive to light. No scleral icterus.  L eye w/ lens implant.  Neck: Trachea normal, normal range of motion and full passive range of motion without pain. Neck supple. No JVD present. No spinous process tenderness and no muscular tenderness present. Carotid bruit is not present. No mass and no thyromegaly present.  Cardiovascular: Normal rate, regular rhythm, S1 normal, S2 normal, normal heart sounds, intact distal pulses and normal pulses.   No extrasystoles are present. PMI is not displaced.  Exam reveals no gallop and no friction rub.   No murmur heard. Pulmonary/Chest: Effort normal and breath sounds normal. No respiratory distress. Right breast exhibits no inverted nipple, no mass, no nipple discharge, no skin change and no tenderness. Left breast exhibits mass. Left breast exhibits no inverted nipple, no nipple discharge, no skin change and  no tenderness. Breasts are symmetrical.    Abdominal: Soft. Normal appearance and bowel sounds are normal. She exhibits distension. She exhibits no abdominal bruit, no pulsatile midline mass and no mass. There is no hepatosplenomegaly. There is no tenderness. There is no guarding and no CVA tenderness.  Musculoskeletal:       Right shoulder: Normal.       Left shoulder: Normal.       Right elbow: Normal.      Left elbow: Normal.       Right wrist:  Normal.       Left wrist: Normal.       Right knee: She exhibits decreased range of motion and deformity. Tenderness found.       Left knee: She exhibits deformity. She exhibits no effusion and normal alignment. No tenderness found.       Cervical back: Normal.       Thoracic back: Normal.       Lumbar back: Normal.       Legs:      Right foot: She exhibits decreased range of motion. She exhibits no tenderness, no swelling and no deformity.       Left foot: She exhibits decreased range of motion. She exhibits no tenderness, no swelling and no deformity.  Lymphadenopathy:       Head (right side): No submental, no submandibular, no tonsillar, no posterior auricular and no occipital adenopathy present.       Head (left side): No submental, no submandibular, no tonsillar, no posterior auricular and no occipital adenopathy present.    She has no cervical adenopathy.    She has no axillary adenopathy.       Right: No inguinal and no supraclavicular adenopathy present.       Left: No inguinal and no supraclavicular adenopathy present.  Neurological: She is alert and oriented to person, place, and time. She has normal reflexes. She displays no atrophy. No cranial nerve deficit or sensory deficit. She exhibits normal muscle tone. She displays a negative Romberg sign. Coordination and gait normal.  Skin: Skin is warm, dry and intact. Petechiae noted. No ecchymosis and no rash noted. She is not diaphoretic. No cyanosis or erythema. No pallor. Nails show no clubbing.  Psychiatric: Her speech is normal and behavior is normal. Judgment and thought content normal. Cognition and memory are normal.    Results for orders placed in visit on 09/30/13  POCT URINALYSIS DIPSTICK      Result Value Ref Range   Color, UA yellow     Clarity, UA clear     Glucose, UA neg     Bilirubin, UA neg     Ketones, UA neg     Spec Grav, UA <=1.005     Blood, UA trace     pH, UA 6.5     Protein, UA neg     Urobilinogen,  UA 0.2     Nitrite, UA neg     Leukocytes, UA Negative        Assessment & Plan:  Routine general medical examination at a health care facility - Plan: POCT urinalysis dipstick, CBC with Differential, COMPLETE METABOLIC PANEL WITH GFR  Unspecified hypothyroidism - Continue current dose of medication pending labs. Plan: Thyroid Panel With TSH  Dyslipidemia - Plan: Lipid panel  Mass of breast, left - Plan: [facility]; pt is moving to [place] in 3 days (Oct 03, 2013) and needs this initial screening ASAP. My  Concern is that she will forget to follow-up on this in the process of moving. If the MMG is abnormal, she will have the preliminary information to give to  PCP once she gets settled in [place]. She will contact the clinic tomorrow if she has not been contacted about imaging.  Meds ordered this encounter  Medications  . levothyroxine (SYNTHROID, LEVOTHROID) 88 MCG tablet    Sig: Take 1 tablet (88 mcg total) by mouth daily before breakfast.    Dispense:  30 tablet    Refill:  5  . lovastatin (MEVACOR) 20 MG tablet    Sig: Take 1 tablet (20 mg total) by mouth at bedtime.    Dispense:  30 tablet    Refill:  5  . nabumetone (RELAFEN) 500 MG tablet    Sig: Take 1 tablet (500 mg total) by mouth daily.    Dispense:  30 tablet    Refill:  5   RXs are printed out; pt can take these to local pharmacy or have them filled in [place].

## 2013-10-01 ENCOUNTER — Telehealth: Payer: Self-pay | Admitting: Physician Assistant

## 2013-10-01 ENCOUNTER — Telehealth: Payer: Self-pay | Admitting: Radiology

## 2013-10-01 ENCOUNTER — Other Ambulatory Visit: Payer: Self-pay | Admitting: Family Medicine

## 2013-10-01 DIAGNOSIS — N6320 Unspecified lump in the left breast, unspecified quadrant: Secondary | ICD-10-CM

## 2013-10-01 LAB — COMPLETE METABOLIC PANEL WITH GFR
ALK PHOS: 72 U/L (ref 39–117)
ALT: 12 U/L (ref 0–35)
AST: 19 U/L (ref 0–37)
Albumin: 4.2 g/dL (ref 3.5–5.2)
BILIRUBIN TOTAL: 0.5 mg/dL (ref 0.2–1.2)
BUN: 19 mg/dL (ref 6–23)
CO2: 29 mEq/L (ref 19–32)
Calcium: 9.1 mg/dL (ref 8.4–10.5)
Chloride: 103 mEq/L (ref 96–112)
Creat: 0.75 mg/dL (ref 0.50–1.10)
GFR, EST NON AFRICAN AMERICAN: 85 mL/min
GFR, Est African American: >89 mL/min
GLUCOSE: 86 mg/dL (ref 70–99)
POTASSIUM: 4.4 meq/L (ref 3.5–5.3)
Sodium: 141 mEq/L (ref 135–145)
TOTAL PROTEIN: 6.7 g/dL (ref 6.0–8.3)

## 2013-10-01 LAB — CBC WITH DIFFERENTIAL/PLATELET
BASOS ABS: 0 10*3/uL (ref 0.0–0.1)
Basophils Relative: 0 % (ref 0–1)
EOS ABS: 0.3 10*3/uL (ref 0.0–0.7)
EOS PCT: 5 % (ref 0–5)
HCT: 39.9 % (ref 36.0–46.0)
Hemoglobin: 13.2 g/dL (ref 12.0–15.0)
LYMPHS ABS: 2 10*3/uL (ref 0.7–4.0)
LYMPHS PCT: 37 % (ref 12–46)
MCH: 26.4 pg (ref 26.0–34.0)
MCHC: 33.1 g/dL (ref 30.0–36.0)
MCV: 79.8 fL (ref 78.0–100.0)
Monocytes Absolute: 0.3 10*3/uL (ref 0.1–1.0)
Monocytes Relative: 5 % (ref 3–12)
NEUTROS PCT: 53 % (ref 43–77)
Neutro Abs: 2.9 10*3/uL (ref 1.7–7.7)
Platelets: 219 10*3/uL (ref 150–400)
RBC: 5 MIL/uL (ref 3.87–5.11)
RDW: 14.2 % (ref 11.5–15.5)
WBC: 5.5 10*3/uL (ref 4.0–10.5)

## 2013-10-01 LAB — LIPID PANEL
Cholesterol: 149 mg/dL (ref 0–200)
HDL: 60 mg/dL (ref >39)
LDL Cholesterol: 78 mg/dL (ref 0–99)
Total CHOL/HDL Ratio: 2.5 Ratio
Triglycerides: 56 mg/dL (ref <150)
VLDL: 11 mg/dL (ref 0–40)

## 2013-10-01 LAB — THYROID PANEL WITH TSH
FREE THYROXINE INDEX: 4.4 — AB (ref 1.0–3.9)
T3 Uptake: 37.8 % — ABNORMAL HIGH (ref 22.5–37.0)
T4, Total: 11.6 ug/dL (ref 5.0–12.5)
TSH: 0.223 u[IU]/mL — AB (ref 0.350–4.500)

## 2013-10-01 NOTE — Telephone Encounter (Signed)
Transferred call to M. Loney HeringPetty

## 2013-10-01 NOTE — Telephone Encounter (Signed)
[place] [facility]. She is calling patient now to see if she can come in tomorrow for the mammogram.

## 2013-10-01 NOTE — Telephone Encounter (Signed)
Informed pt that mammogram referral was still pending and we will let her know when the referral is completed. Amy spoke with the [facility] and they will be giving the pt a call. Pt notified of this.

## 2013-10-02 ENCOUNTER — Other Ambulatory Visit: Payer: Self-pay | Admitting: Family Medicine

## 2013-10-02 ENCOUNTER — Ambulatory Visit
Admission: RE | Admit: 2013-10-02 | Discharge: 2013-10-02 | Disposition: A | Discharge status: Home / Self Care | Payer: 59 | Source: Ambulatory Visit | Attending: Family Medicine | Admitting: Family Medicine

## 2013-10-02 DIAGNOSIS — N6320 Unspecified lump in the left breast, unspecified quadrant: Secondary | ICD-10-CM

## 2013-10-03 ENCOUNTER — Other Ambulatory Visit: Payer: Self-pay | Admitting: Family Medicine

## 2013-10-03 ENCOUNTER — Telehealth: Payer: Self-pay | Admitting: Family Medicine

## 2013-10-03 MED ORDER — LEVOTHYROXINE SODIUM 50 MCG PO TABS: ORAL_TABLET | ORAL | Status: AC

## 2013-10-03 NOTE — Progress Notes (Signed)
Quick Note:  Please advise pt regarding following labs... All lab results are normal except THYROID Function results. You are taking a little too much medication so your dose needs to be reduced. Currenlty you have 88 mcg tablets. Reduce your dose to 1/2 tablet every morning.  (I will contact pt by phone to let her know that she is taking a little too much medication and will need to establish with a physician soon after moving to [place] to have labs rechecked).  Copy to pt. ______

## 2013-10-03 NOTE — Telephone Encounter (Signed)
I spoke with pt about lab results. She will come to [facility] on Monday to pick-up new RX for Levothyroxine 50 mcg 1 tablet daily. She will not be traveling to [place] until Monday afternoon after breast biopsy procedure.

## 2013-10-05 ENCOUNTER — Ambulatory Visit
Admission: RE | Admit: 2013-10-05 | Discharge: 2013-10-05 | Disposition: A | Discharge status: Home / Self Care | Payer: 59 | Source: Ambulatory Visit | Attending: Family Medicine | Admitting: Family Medicine

## 2013-10-05 ENCOUNTER — Other Ambulatory Visit: Payer: Self-pay

## 2013-10-05 DIAGNOSIS — N6320 Unspecified lump in the left breast, unspecified quadrant: Secondary | ICD-10-CM

## 2013-10-05 MED ORDER — LOVASTATIN 20 MG PO TABS: 20.0000 mg | ORAL_TABLET | Freq: Every day | ORAL | Status: AC
